# Patient Record
Sex: Female | Born: 2000 | Race: White | Hispanic: No | Marital: Single | Smoking: Never smoker
Health system: Southern US, Community
[De-identification: ages and names within clinical notes are randomized; demographics above are authoritative.]

---
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
## 2020-01-22 DIAGNOSIS — Z20822 Contact with and (suspected) exposure to covid-19: Secondary | ICD-10-CM | POA: Diagnosis not present

## 2020-01-22 DIAGNOSIS — R0981 Nasal congestion: Secondary | ICD-10-CM | POA: Diagnosis not present

## 2020-01-29 DIAGNOSIS — R1013 Epigastric pain: Secondary | ICD-10-CM | POA: Diagnosis not present

## 2020-02-04 DIAGNOSIS — K2970 Gastritis, unspecified, without bleeding: Secondary | ICD-10-CM | POA: Diagnosis not present

## 2020-02-09 DIAGNOSIS — Z3042 Encounter for surveillance of injectable contraceptive: Secondary | ICD-10-CM | POA: Diagnosis not present

## 2020-02-22 DIAGNOSIS — R0981 Nasal congestion: Secondary | ICD-10-CM | POA: Diagnosis not present

## 2020-02-22 DIAGNOSIS — R509 Fever, unspecified: Secondary | ICD-10-CM | POA: Diagnosis not present

## 2020-02-22 DIAGNOSIS — U071 COVID-19: Secondary | ICD-10-CM | POA: Diagnosis not present

## 2020-02-22 DIAGNOSIS — M791 Myalgia, unspecified site: Secondary | ICD-10-CM | POA: Diagnosis not present

## 2020-03-18 DIAGNOSIS — R110 Nausea: Secondary | ICD-10-CM | POA: Diagnosis not present

## 2020-03-18 DIAGNOSIS — R1013 Epigastric pain: Secondary | ICD-10-CM | POA: Diagnosis not present

## 2020-03-24 DIAGNOSIS — N898 Other specified noninflammatory disorders of vagina: Secondary | ICD-10-CM | POA: Diagnosis not present

## 2020-03-24 DIAGNOSIS — Z113 Encounter for screening for infections with a predominantly sexual mode of transmission: Secondary | ICD-10-CM | POA: Diagnosis not present

## 2020-03-26 DIAGNOSIS — F418 Other specified anxiety disorders: Secondary | ICD-10-CM | POA: Diagnosis not present

## 2020-03-26 DIAGNOSIS — Z0000 Encounter for general adult medical examination without abnormal findings: Secondary | ICD-10-CM | POA: Diagnosis not present

## 2020-03-26 DIAGNOSIS — Z1331 Encounter for screening for depression: Secondary | ICD-10-CM | POA: Diagnosis not present

## 2020-03-26 DIAGNOSIS — J45909 Unspecified asthma, uncomplicated: Secondary | ICD-10-CM | POA: Diagnosis not present

## 2020-03-26 DIAGNOSIS — R1013 Epigastric pain: Secondary | ICD-10-CM | POA: Diagnosis not present

## 2020-04-01 DIAGNOSIS — F609 Personality disorder, unspecified: Secondary | ICD-10-CM | POA: Diagnosis not present

## 2020-04-01 DIAGNOSIS — F438 Other reactions to severe stress: Secondary | ICD-10-CM | POA: Diagnosis not present

## 2020-04-26 DIAGNOSIS — Z3042 Encounter for surveillance of injectable contraceptive: Secondary | ICD-10-CM | POA: Diagnosis not present

## 2020-05-26 DIAGNOSIS — R1013 Epigastric pain: Secondary | ICD-10-CM | POA: Diagnosis not present

## 2020-05-26 DIAGNOSIS — J45909 Unspecified asthma, uncomplicated: Secondary | ICD-10-CM | POA: Diagnosis not present

## 2020-05-26 DIAGNOSIS — F418 Other specified anxiety disorders: Secondary | ICD-10-CM | POA: Diagnosis not present

## 2020-05-26 DIAGNOSIS — R110 Nausea: Secondary | ICD-10-CM | POA: Diagnosis not present

## 2020-07-26 DIAGNOSIS — Z3042 Encounter for surveillance of injectable contraceptive: Secondary | ICD-10-CM | POA: Diagnosis not present

## 2020-07-27 DIAGNOSIS — Z681 Body mass index (BMI) 19 or less, adult: Secondary | ICD-10-CM | POA: Diagnosis not present

## 2020-07-27 DIAGNOSIS — F418 Other specified anxiety disorders: Secondary | ICD-10-CM | POA: Diagnosis not present

## 2020-07-27 DIAGNOSIS — R1013 Epigastric pain: Secondary | ICD-10-CM | POA: Diagnosis not present

## 2020-07-27 DIAGNOSIS — R253 Fasciculation: Secondary | ICD-10-CM | POA: Diagnosis not present

## 2020-08-23 DIAGNOSIS — M25551 Pain in right hip: Secondary | ICD-10-CM | POA: Diagnosis not present

## 2020-09-14 DIAGNOSIS — J4520 Mild intermittent asthma, uncomplicated: Secondary | ICD-10-CM | POA: Diagnosis not present

## 2020-09-14 DIAGNOSIS — G4733 Obstructive sleep apnea (adult) (pediatric): Secondary | ICD-10-CM | POA: Diagnosis not present

## 2020-09-14 DIAGNOSIS — R5383 Other fatigue: Secondary | ICD-10-CM | POA: Diagnosis not present

## 2021-07-25 DIAGNOSIS — Z30430 Encounter for insertion of intrauterine contraceptive device: Secondary | ICD-10-CM | POA: Diagnosis not present

## 2021-07-25 DIAGNOSIS — Z3202 Encounter for pregnancy test, result negative: Secondary | ICD-10-CM | POA: Diagnosis not present

## 2021-08-16 DIAGNOSIS — Z30431 Encounter for routine checking of intrauterine contraceptive device: Secondary | ICD-10-CM | POA: Diagnosis not present

## 2021-10-01 DIAGNOSIS — J45909 Unspecified asthma, uncomplicated: Secondary | ICD-10-CM | POA: Diagnosis not present

## 2021-10-01 DIAGNOSIS — R0602 Shortness of breath: Secondary | ICD-10-CM | POA: Diagnosis not present

## 2021-10-01 DIAGNOSIS — R059 Cough, unspecified: Secondary | ICD-10-CM | POA: Diagnosis not present

## 2021-10-01 DIAGNOSIS — J018 Other acute sinusitis: Secondary | ICD-10-CM | POA: Diagnosis not present

## 2021-10-01 DIAGNOSIS — Z6820 Body mass index (BMI) 20.0-20.9, adult: Secondary | ICD-10-CM | POA: Diagnosis not present

## 2022-01-17 DIAGNOSIS — U099 Post covid-19 condition, unspecified: Secondary | ICD-10-CM | POA: Diagnosis not present

## 2022-01-17 DIAGNOSIS — J069 Acute upper respiratory infection, unspecified: Secondary | ICD-10-CM | POA: Diagnosis not present

## 2022-03-23 DIAGNOSIS — R509 Fever, unspecified: Secondary | ICD-10-CM | POA: Diagnosis not present

## 2022-03-23 DIAGNOSIS — J039 Acute tonsillitis, unspecified: Secondary | ICD-10-CM | POA: Diagnosis not present

## 2022-05-16 DIAGNOSIS — J3489 Other specified disorders of nose and nasal sinuses: Secondary | ICD-10-CM | POA: Diagnosis not present

## 2022-05-16 DIAGNOSIS — R0981 Nasal congestion: Secondary | ICD-10-CM | POA: Diagnosis not present

## 2022-05-16 DIAGNOSIS — R519 Headache, unspecified: Secondary | ICD-10-CM | POA: Diagnosis not present

## 2022-05-16 DIAGNOSIS — Z9109 Other allergy status, other than to drugs and biological substances: Secondary | ICD-10-CM | POA: Diagnosis not present

## 2022-06-19 DIAGNOSIS — S8002XA Contusion of left knee, initial encounter: Secondary | ICD-10-CM | POA: Diagnosis not present

## 2022-06-21 DIAGNOSIS — Z1329 Encounter for screening for other suspected endocrine disorder: Secondary | ICD-10-CM | POA: Diagnosis not present

## 2022-06-21 DIAGNOSIS — Z13220 Encounter for screening for lipoid disorders: Secondary | ICD-10-CM | POA: Diagnosis not present

## 2022-06-21 DIAGNOSIS — Z130 Encounter for screening for diseases of the blood and blood-forming organs and certain disorders involving the immune mechanism: Secondary | ICD-10-CM | POA: Diagnosis not present

## 2022-06-21 DIAGNOSIS — Z124 Encounter for screening for malignant neoplasm of cervix: Secondary | ICD-10-CM | POA: Diagnosis not present

## 2022-06-21 DIAGNOSIS — Z0000 Encounter for general adult medical examination without abnormal findings: Secondary | ICD-10-CM | POA: Diagnosis not present

## 2022-06-21 DIAGNOSIS — Z131 Encounter for screening for diabetes mellitus: Secondary | ICD-10-CM | POA: Diagnosis not present

## 2022-06-27 DIAGNOSIS — R102 Pelvic and perineal pain: Secondary | ICD-10-CM | POA: Diagnosis not present

## 2022-06-27 DIAGNOSIS — T839XXA Unspecified complication of genitourinary prosthetic device, implant and graft, initial encounter: Secondary | ICD-10-CM | POA: Diagnosis not present

## 2022-06-27 DIAGNOSIS — N3001 Acute cystitis with hematuria: Secondary | ICD-10-CM | POA: Diagnosis not present

## 2022-06-27 DIAGNOSIS — B009 Herpesviral infection, unspecified: Secondary | ICD-10-CM | POA: Diagnosis not present

## 2022-07-04 DIAGNOSIS — T839XXA Unspecified complication of genitourinary prosthetic device, implant and graft, initial encounter: Secondary | ICD-10-CM | POA: Diagnosis not present

## 2022-10-03 DIAGNOSIS — R102 Pelvic and perineal pain: Secondary | ICD-10-CM | POA: Diagnosis not present

## 2022-10-03 DIAGNOSIS — N83201 Unspecified ovarian cyst, right side: Secondary | ICD-10-CM | POA: Diagnosis not present

## 2022-10-19 DIAGNOSIS — N83201 Unspecified ovarian cyst, right side: Secondary | ICD-10-CM | POA: Diagnosis not present

## 2022-10-19 DIAGNOSIS — N83202 Unspecified ovarian cyst, left side: Secondary | ICD-10-CM | POA: Diagnosis not present

## 2022-10-19 DIAGNOSIS — R102 Pelvic and perineal pain: Secondary | ICD-10-CM | POA: Diagnosis not present

## 2022-10-19 DIAGNOSIS — Z975 Presence of (intrauterine) contraceptive device: Secondary | ICD-10-CM | POA: Diagnosis not present

## 2022-10-23 DIAGNOSIS — J018 Other acute sinusitis: Secondary | ICD-10-CM | POA: Diagnosis not present

## 2022-10-23 DIAGNOSIS — R051 Acute cough: Secondary | ICD-10-CM | POA: Diagnosis not present

## 2022-10-23 DIAGNOSIS — J45901 Unspecified asthma with (acute) exacerbation: Secondary | ICD-10-CM | POA: Diagnosis not present

## 2022-10-23 DIAGNOSIS — J3489 Other specified disorders of nose and nasal sinuses: Secondary | ICD-10-CM | POA: Diagnosis not present

## 2022-12-27 DIAGNOSIS — Z3202 Encounter for pregnancy test, result negative: Secondary | ICD-10-CM | POA: Diagnosis not present

## 2022-12-27 DIAGNOSIS — R110 Nausea: Secondary | ICD-10-CM | POA: Diagnosis not present

## 2022-12-27 DIAGNOSIS — Z681 Body mass index (BMI) 19 or less, adult: Secondary | ICD-10-CM | POA: Diagnosis not present

## 2023-01-18 DIAGNOSIS — J209 Acute bronchitis, unspecified: Secondary | ICD-10-CM | POA: Diagnosis not present

## 2023-01-18 DIAGNOSIS — R051 Acute cough: Secondary | ICD-10-CM | POA: Diagnosis not present

## 2023-01-18 DIAGNOSIS — J019 Acute sinusitis, unspecified: Secondary | ICD-10-CM | POA: Diagnosis not present

## 2023-01-29 DIAGNOSIS — J45901 Unspecified asthma with (acute) exacerbation: Secondary | ICD-10-CM | POA: Diagnosis not present

## 2023-01-29 DIAGNOSIS — R051 Acute cough: Secondary | ICD-10-CM | POA: Diagnosis not present

## 2023-02-06 DIAGNOSIS — J208 Acute bronchitis due to other specified organisms: Secondary | ICD-10-CM | POA: Diagnosis not present

## 2023-02-06 DIAGNOSIS — Z681 Body mass index (BMI) 19 or less, adult: Secondary | ICD-10-CM | POA: Diagnosis not present

## 2023-02-08 ENCOUNTER — Ambulatory Visit: Payer: Medicaid Other | Admitting: Allergy

## 2023-02-08 NOTE — Progress Notes (Deleted)
New Patient Note  RE: Molly Mckenzie MRN: [id number] DOB: 2000-12-15 Date of Office Visit: 02/08/2023  Consult requested by: No ref. provider found Primary care provider: Patient, [name]  Chief Complaint: No chief complaint on file.  History of Present Illness: I had the pleasure of seeing Molly Mckenzie for initial evaluation at the [facility] on 02/08/2023. She is a 23 y.o. female, who is referred here by Patient, [name] for the evaluation of ***.  Discussed the use of AI scribe software for clinical note transcription with the patient, who gave verbal consent to proceed.  History of Present Illness             ***  Assessment and Plan: Molly Mckenzie is a 23 y.o. female with: ***  Assessment and Plan               No follow-ups on file.  No orders of the defined types were placed in this encounter.  Lab Orders  No laboratory test(s) ordered today    Other allergy screening: Asthma: {Blank single:19197::"yes","no"} Rhino conjunctivitis: {Blank single:19197::"yes","no"} Food allergy: {Blank single:19197::"yes","no"} Medication allergy: {Blank single:19197::"yes","no"} Hymenoptera allergy: {Blank single:19197::"yes","no"} Urticaria: {Blank single:19197::"yes","no"} Eczema:{Blank single:19197::"yes","no"} History of recurrent infections suggestive of immunodeficency: {Blank single:19197::"yes","no"}  Diagnostics: Spirometry:  Tracings reviewed. Her effort: {Blank single:19197::"Good reproducible efforts.","It was hard to get consistent efforts and there is a question as to whether this reflects a maximal maneuver.","Poor effort, data can not be interpreted."} FVC: ***L FEV1: ***L, ***% predicted FEV1/FVC ratio: ***% Interpretation: {Blank single:19197::"Spirometry consistent with mild obstructive disease","Spirometry consistent with moderate obstructive disease","Spirometry consistent with severe obstructive disease","Spirometry consistent  with possible restrictive disease","Spirometry consistent with mixed obstructive and restrictive disease","Spirometry uninterpretable due to technique","Spirometry consistent with normal pattern","No overt abnormalities noted given today's efforts"}.  Please see scanned spirometry results for details.  Skin Testing: {Blank single:19197::"Select foods","Environmental allergy panel","Environmental allergy panel and select foods","Food allergy panel","None","Deferred due to recent antihistamines use"}. *** Results discussed with patient/family.   Past Medical History: There are no active problems to display for this patient.  No past medical history on file. Past Surgical History: No past surgical history on file. Medication List:  Current Outpatient Medications  Medication Sig Dispense Refill  . albuterol (PROVENTIL HFA;VENTOLIN HFA) 108 (90 BASE) MCG/ACT inhaler Inhale 2 puffs into the lungs every 6 (six) hours as needed for wheezing or shortness of breath.     No current facility-administered medications for this visit.   Allergies: No Known Allergies Social History: Social History   Socioeconomic History  . Marital status: Single    Spouse name: Not on file  . Number of children: Not on file  . Years of education: Not on file  . Highest education level: Not on file  Occupational History  . Not on file  Tobacco Use  . Smoking status: Never  . Smokeless tobacco: Not on file  Substance and Sexual Activity  . Alcohol use: No  . Drug use: No  . Sexual activity: Not on file  Other Topics Concern  . Not on file  Social History Narrative  . Not on file   Social Drivers of Health   Financial Resource Strain: Not on file  Food Insecurity: Not on file  Transportation Needs: Not on file  Physical Activity: Not on file  Stress: Not on file  Social Connections: Not on file   Lives in a ***. Smoking: *** Occupation: ***  Environmental History: Water Damage/mildew in the  house: {Blank single:19197::"yes","no"} Carpet in the family room: {Blank single:19197::"yes","no"} Carpet in the bedroom: {Blank single:19197::"yes","no"} Heating: {Blank single:19197::"electric","gas","heat pump"} Cooling: {Blank single:19197::"central","window","heat pump"} Pet: {Blank single:19197::"yes ***","no"}  Family History: No family history on file. Problem                               Relation Asthma                                   *** Eczema                                *** Food allergy                          *** Allergic rhino conjunctivitis     ***  Review of Systems  Constitutional:  Negative for appetite change, chills, fever and unexpected weight change.  HENT:  Negative for congestion and rhinorrhea.   Eyes:  Negative for itching.  Respiratory:  Negative for cough, chest tightness, shortness of breath and wheezing.   Cardiovascular:  Negative for chest pain.  Gastrointestinal:  Negative for abdominal pain.  Genitourinary:  Negative for difficulty urinating.  Skin:  Negative for rash.  Neurological:  Negative for headaches.   Objective: There were no vitals taken for this visit. There is no height or weight on file to calculate BMI. Physical Exam Vitals and nursing note reviewed.  Constitutional:      Appearance: Normal appearance. She is well-developed.  HENT:     Head: Normocephalic and atraumatic.     Right Ear: Tympanic membrane and external ear normal.     Left Ear: Tympanic membrane and external ear normal.     Nose: Nose normal.     Mouth/Throat:     Mouth: Mucous membranes are moist.     Pharynx: Oropharynx is clear.  Eyes:     Conjunctiva/sclera: Conjunctivae normal.  Cardiovascular:     Rate and Rhythm: Normal rate and regular rhythm.     Heart sounds: Normal heart sounds. No murmur heard.    No friction rub. No gallop.  Pulmonary:     Effort: Pulmonary effort is normal.     Breath sounds: Normal breath sounds. No wheezing, rhonchi  or rales.  Musculoskeletal:     Cervical back: Neck supple.  Skin:    General: Skin is warm.     Findings: No rash.  Neurological:     Mental Status: She is alert and oriented to person, place, and time.  Psychiatric:        Behavior: Behavior normal.  The plan was reviewed with the patient/family, and all questions/concerned were addressed.  It was my pleasure to see Molly Mckenzie today and participate in her care. Please feel free to contact me with any questions or concerns.  Sincerely,  Wyline Mood, DO Allergy & Immunology  [facility] office: [phone] [facility] office: [phone]

## 2023-02-19 NOTE — Progress Notes (Deleted)
 New Patient Note  RE: Molly Mckenzie MRN: [id number] DOB: [birth year] Date of Office Visit: 02/20/2023  Consult requested by: No ref. provider found Primary care provider: Patient, [name]  Chief Complaint: No chief complaint on file.  History of Present Illness: I had the pleasure of seeing Molly Mckenzie for initial evaluation at the [facility] on 02/19/2023. She is a 23 y.o. female, who is referred here by Patient, [name] for the evaluation of ***.  Discussed the use of AI scribe software for clinical note transcription with the patient, who gave verbal consent to proceed.  History of Present Illness             ***  Assessment and Plan: [name] is a 23 y.o. female with: ***  Assessment and Plan               No follow-ups on file.  No orders of the defined types were placed in this encounter.  Lab Orders  No laboratory test(s) ordered today    Other allergy screening: Asthma: {Blank single:19197::yes,no} Rhino conjunctivitis: {Blank single:19197::yes,no} Food allergy: {Blank single:19197::yes,no} Medication allergy: {Blank single:19197::yes,no} Hymenoptera allergy: {Blank single:19197::yes,no} Urticaria: {Blank single:19197::yes,no} Eczema:{Blank single:19197::yes,no} History of recurrent infections suggestive of immunodeficency: {Blank single:19197::yes,no}  Diagnostics: Spirometry:  Tracings reviewed. Her effort: {Blank single:19197::Good reproducible efforts.,It was hard to get consistent efforts and there is a question as to whether this reflects a maximal maneuver.,Poor effort, data can not be interpreted.} FVC: ***L FEV1: ***L, ***% predicted FEV1/FVC ratio: ***% Interpretation: {Blank single:19197::Spirometry consistent with mild obstructive disease,Spirometry consistent with moderate obstructive disease,Spirometry consistent with severe obstructive disease,Spirometry consistent  with possible restrictive disease,Spirometry consistent with mixed obstructive and restrictive disease,Spirometry uninterpretable due to technique,Spirometry consistent with normal pattern,No overt abnormalities noted given today's efforts}.  Please see scanned spirometry results for details.  Skin Testing: {Blank single:19197::Select foods,Environmental allergy panel,Environmental allergy panel and select foods,Food allergy panel,None,Deferred due to recent antihistamines use}. *** Results discussed with patient/family.   Past Medical History: There are no active problems to display for this patient.  No past medical history on file. Past Surgical History: No past surgical history on file. Medication List:  Current Outpatient Medications  Medication Sig Dispense Refill  . albuterol  (PROVENTIL  HFA;VENTOLIN  HFA) 108 (90 BASE) MCG/ACT inhaler Inhale 2 puffs into the lungs every 6 (six) hours as needed for wheezing or shortness of breath.     No current facility-administered medications for this visit.   Allergies: No Known Allergies Social History: Social History   Socioeconomic History  . Marital status: Single    Spouse name: Not on file  . Number of children: Not on file  . Years of education: Not on file  . Highest education level: Not on file  Occupational History  . Not on file  Tobacco Use  . Smoking status: Never  . Smokeless tobacco: Not on file  Substance and Sexual Activity  . Alcohol use: No  . Drug use: No  . Sexual activity: Not on file  Other Topics Concern  . Not on file  Social History Narrative  . Not on file   Social Drivers of Health   Financial Resource Strain: Not on file  Food Insecurity: Not on file  Transportation Needs: Not on file  Physical Activity: Not on file  Stress: Not on file  Social Connections: Not on file   Lives in a ***. Smoking: *** Occupation: ***  Environmental History: Water Damage/mildew in the  house: {Blank single:19197::yes,no} Carpet in the family room: {Blank single:19197::yes,no} Carpet in the bedroom: {Blank single:19197::yes,no} Heating: {Blank single:19197::electric,gas,heat pump} Cooling: {Blank single:19197::central,window,heat pump} Pet: {Blank single:19197::yes ***,no}  Family History: No family history on file. Problem                               Relation Asthma                                   *** Eczema                                *** Food allergy                          *** Allergic rhino conjunctivitis     ***  Review of Systems  Constitutional:  Negative for appetite change, chills, fever and unexpected weight change.  HENT:  Negative for congestion and rhinorrhea.   Eyes:  Negative for itching.  Respiratory:  Negative for cough, chest tightness, shortness of breath and wheezing.   Cardiovascular:  Negative for chest pain.  Gastrointestinal:  Negative for abdominal pain.  Genitourinary:  Negative for difficulty urinating.  Skin:  Negative for rash.  Neurological:  Negative for headaches.   Objective: There were no vitals taken for this visit. There is no height or weight on file to calculate BMI. Physical Exam Vitals and nursing note reviewed.  Constitutional:      Appearance: Normal appearance. She is well-developed.  HENT:     Head: Normocephalic and atraumatic.     Right Ear: Tympanic membrane and external ear normal.     Left Ear: Tympanic membrane and external ear normal.     Nose: Nose normal.     Mouth/Throat:     Mouth: Mucous membranes are moist.     Pharynx: Oropharynx is clear.  Eyes:     Conjunctiva/sclera: Conjunctivae normal.  Cardiovascular:     Rate and Rhythm: Normal rate and regular rhythm.     Heart sounds: Normal heart sounds. No murmur heard.    No friction rub. No gallop.  Pulmonary:     Effort: Pulmonary effort is normal.     Breath sounds: Normal breath sounds. No wheezing, rhonchi  or rales.  Musculoskeletal:     Cervical back: Neck supple.  Skin:    General: Skin is warm.     Findings: No rash.  Neurological:     Mental Status: She is alert and oriented to person, place, and time.  Psychiatric:        Behavior: Behavior normal.  The plan was reviewed with the patient/family, and all questions/concerned were addressed.  It was my pleasure to see Yachet today and participate in her care. Please feel free to contact me with any questions or concerns.  Sincerely,  Orlan Cramp, DO Allergy & Immunology  [facility]  [facility] office: [phone] [facility] office: [phone]

## 2023-02-20 ENCOUNTER — Ambulatory Visit: Payer: Medicaid Other | Admitting: Allergy

## 2023-02-21 ENCOUNTER — Encounter: Payer: Self-pay | Admitting: Allergy

## 2023-02-21 ENCOUNTER — Ambulatory Visit (INDEPENDENT_AMBULATORY_CARE_PROVIDER_SITE_OTHER): Payer: Medicaid Other | Admitting: Allergy

## 2023-02-21 VITALS — BP 110/60 | HR 72 | Temp 98.7°F | Resp 16 | Ht 66.0 in | Wt 113.0 lb

## 2023-02-21 DIAGNOSIS — J3089 Other allergic rhinitis: Secondary | ICD-10-CM

## 2023-02-21 DIAGNOSIS — J4520 Mild intermittent asthma, uncomplicated: Secondary | ICD-10-CM | POA: Diagnosis not present

## 2023-02-21 DIAGNOSIS — R12 Heartburn: Secondary | ICD-10-CM | POA: Diagnosis not present

## 2023-02-21 DIAGNOSIS — R058 Other specified cough: Secondary | ICD-10-CM | POA: Diagnosis not present

## 2023-02-21 MED ORDER — BUDESONIDE-FORMOTEROL FUMARATE 80-4.5 MCG/ACT IN AERO: 2.0000 | INHALATION_SPRAY | Freq: Two times a day (BID) | RESPIRATORY_TRACT | 1 refills | Status: AC

## 2023-02-21 MED ORDER — ALBUTEROL SULFATE HFA 108 (90 BASE) MCG/ACT IN AERS: 2.0000 | INHALATION_SPRAY | RESPIRATORY_TRACT | 1 refills | Status: AC | PRN

## 2023-02-21 MED ORDER — OMEPRAZOLE MAGNESIUM 20 MG PO TBEC: 20.0000 mg | DELAYED_RELEASE_TABLET | Freq: Every day | ORAL | 0 refills | Status: AC

## 2023-02-21 NOTE — Progress Notes (Signed)
 New Patient Note  RE: Molly Mckenzie MRN: [id number] DOB: Jun 12, 2000 Date of Office Visit: 02/21/2023  Consult requested by: No ref. provider found Primary care provider: Patient, [name]  Chief Complaint: Cough (Since before christmas )  History of Present Illness: I had the pleasure of seeing Molly Mckenzie for initial evaluation at the [facility] on 02/21/2023. She is a 23 y.o. female, who is self-referred here for the evaluation of cough.  Discussed the use of AI scribe software for clinical note transcription with the patient, who gave verbal consent to proceed.  For approximately six weeks, she has experienced a persistent cough that began just before Christmas. The cough alternates between dry and productive with mucus and has been severe enough to induce vomiting on two occasions. She experiences chest tightness and shortness of breath, with wheezing occurring on most days. Her symptoms worsen with physical activity and when lying down, particularly at bedtime and upon waking.  She has attempted to manage her symptoms with an albuterol  inhaler, but finds it triggers her cough and is ineffective. A humidifier worsened her symptoms. She completed a ten-day course of antibiotics prescribed by urgent care for suspected pneumonia, but it did not resolve her symptoms. A subsequent chest x-ray showed no infection, and she was prescribed prednisone, which provided temporary relief. She has been advised to use a nebulizer but has not yet acquired one due to financial constraints. She has visited urgent care twice, where she was initially diagnosed with pneumonia without imaging. After the x-ray, she was told it might be asthma-related. Her primary care provider has also suggested asthma as a possible cause.     In the last month, frequency of symptoms: daily. Frequency of nocturnal symptoms: depends. Frequency of SABA use: daily. Interference with physical activity: yes.   the  last 12 months, emergency room visits/urgent care visits/doctor office visits or hospitalizations due to respiratory issues: twice to UC. In the last 12 months, oral steroids courses: 1. Lifetime history of hospitalization for respiratory issues: no. Prior intubations: no.  History of pneumonia: maybe. She was not evaluated by allergist/pulmonologist in the past. Smoking exposure: denies. Up to date with flu vaccine: no. Up to date with COVID-19 vaccine: no. Prior Covid-19 infection: yes. History of reflux: sometimes.  Assessment and Plan: Molly Mckenzie is a 23 y.o. female with: Cough  Reactive airway disease, mild intermittent, uncomplicated Heartburn Persistent cough for 1.5 months, alternating between dry and productive, exacerbated by exertion and lying down. Previous treatment with albuterol  inhaler, antibiotics, and prednisone with partial response. Singulair caused hallucinations. Today's spirometry was normal pattern with 3% and 90cc improvement in FEV1 post bronchodilator treatment. Clinically feeling unchanged.  Discussed that cough can have multiple etiologies. Given her symptoms and timeline will do 1 month trial of Symbicort  with omeprazole . Daily controller medication(s): start Symbicort  80mcg 2 puffs twice a day with spacer and rinse mouth afterwards. Spacer given and demonstrated proper use with inhaler. Patient understood technique and all questions/concerned were addressed.  If the medication is too expensive let me know.  May use albuterol  rescue inhaler 2 puffs or nebulizer every 4 to 6 hours as needed for shortness of breath, chest tightness, coughing, and wheezing. May use albuterol  rescue inhaler 2 puffs 5 to 15 minutes prior to strenuous physical activities. Monitor frequency of use - if you need to use it more than twice per week on a consistent basis let us  know.  See handout for lifestyle and dietary  modifications. Start omeprazole  20mg  once day  x 1 month - nothing to eat or  drink for 20-30 minutes afterwards.   Other allergic rhinitis Reports symptoms in the fall, currently managed with Benadryl as needed. Consider skin testing in future.   Return in about 4 weeks (around 03/21/2023).  Meds ordered this encounter  Medications   omeprazole  (PRILOSEC  OTC) 20 MG tablet    Sig: Take 1 tablet (20 mg total) by mouth daily.    Dispense:  30 tablet    Refill:  0   budesonide -formoterol  (SYMBICORT ) 80-4.5 MCG/ACT inhaler    Sig: Inhale 2 puffs into the lungs in the morning and at bedtime. with spacer and rinse mouth afterwards.    Dispense:  1 each    Refill:  1   albuterol  (VENTOLIN  HFA) 108 (90 Base) MCG/ACT inhaler    Sig: Inhale 2 puffs into the lungs every 4 (four) hours as needed for wheezing or shortness of breath.    Dispense:  18 g    Refill:  1   Lab Orders  No laboratory test(s) ordered today    Other allergy screening: Rhino conjunctivitis: yes Mainly in the fall. And takes benadryl prn with good benefit.  Food allergy: no Medication allergy: yes Hymenoptera allergy: no Localized swelling.  Urticaria: no Eczema:no History of recurrent infections suggestive of immunodeficency: no  Diagnostics: Spirometry:  Tracings reviewed. Her effort: Good reproducible efforts. FVC: 3.58L FEV1: 3.00L, 84% predicted FEV1/FVC ratio: 84% Interpretation: Spirometry consistent with normal pattern with 3% and 90cc improvement in FEV1 post bronchodilator treatment. Clinically feeling unchanged.   Please see scanned spirometry results for details.  Results discussed with patient/family.   Past Medical History: There are no active problems to display for this patient.  History reviewed. No pertinent past medical history. Past Surgical History: History reviewed. No pertinent surgical history. Medication List:  Current Outpatient Medications  Medication Sig Dispense Refill   acyclovir (ZOVIRAX) 400 MG tablet Take 400 mg by mouth daily.      budesonide -formoterol  (SYMBICORT ) 80-4.5 MCG/ACT inhaler Inhale 2 puffs into the lungs in the morning and at bedtime. with spacer and rinse mouth afterwards. 1 each 1   omeprazole  (PRILOSEC  OTC) 20 MG tablet Take 1 tablet (20 mg total) by mouth daily. 30 tablet 0   albuterol  (VENTOLIN  HFA) 108 (90 Base) MCG/ACT inhaler Inhale 2 puffs into the lungs every 4 (four) hours as needed for wheezing or shortness of breath. 18 g 1   No current facility-administered medications for this visit.   Allergies: Allergies  Allergen Reactions   Montelukast Other (See Comments)    Hallucinations    Social History: Social History   Socioeconomic History   Marital status: Single    Spouse name: Not on file   Number of children: Not on file   Years of education: Not on file   Highest education level: Not on file  Occupational History   Not on file  Tobacco Use   Smoking status: Never    Passive exposure: Past (grandparents and mother)   Smokeless tobacco: Never  Vaping Use   Vaping status: Never Used  Substance and Sexual Activity   Alcohol use: No   Drug use: No   Sexual activity: Not on file  Other Topics Concern   Not on file  Social History Narrative   Not on file   Social Drivers of Health   Financial Resource Strain: Not on file  Food Insecurity: Not on file  Transportation Needs:  Not on file  Physical Activity: Not on file  Stress: Not on file  Social Connections: Not on file   Lives in a trailer. 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  HENT:  Negative for congestion and rhinorrhea.   Eyes:  Negative for itching.  Respiratory:  Positive for cough, chest tightness, shortness of breath and wheezing.   Cardiovascular:  Negative for chest pain.  Gastrointestinal:  Negative for abdominal pain.  Genitourinary:  Negative for difficulty urinating.  Skin:  Negative for rash.  Neurological:  Negative for headaches.    Objective: BP 110/60   Pulse 72   Temp 98.7 F (37.1 C) (Temporal)   Resp 16   Ht 5' 6 (1.676 m)   Wt 113 lb (51.3 kg)   SpO2 98%   BMI 18.24 kg/m  Body mass index is 18.24 kg/m. Physical Exam Vitals and nursing note reviewed.  Constitutional:      Appearance: Normal appearance. She is well-developed.  HENT:     Head: Normocephalic and atraumatic.     Right Ear: Tympanic membrane and external ear normal.     Left Ear: Tympanic membrane and external ear normal.     Nose: Nose normal.     Mouth/Throat:     Mouth: Mucous membranes are moist.     Pharynx: Oropharynx is clear.  Eyes:     Conjunctiva/sclera: Conjunctivae normal.  Cardiovascular:     Rate and Rhythm: Normal rate and regular rhythm.     Heart sounds: Normal heart sounds. No murmur heard.    No friction rub. No gallop.  Pulmonary:     Effort: Pulmonary effort is normal.     Breath sounds: Normal breath sounds. No wheezing, rhonchi or rales.  Musculoskeletal:     Cervical back: Neck supple.  Skin:    General: Skin is warm.     Findings: No rash.  Neurological:     Mental Status: She is alert and oriented to person, place, and time.  Psychiatric:        Behavior: Behavior normal.   The plan was reviewed with the patient/family, and all questions/concerned were addressed.  It was my pleasure to see Molly Mckenzie today and participate in her care. Please feel free to contact me with any questions or concerns.  Sincerely,  Orlan Cramp,  DO Allergy & Immunology  [facility]  [place] office: [phone] [facility] office: [phone]

## 2023-02-21 NOTE — Patient Instructions (Addendum)
 Coughing:  Daily controller medication(s): start Symbicort  80mcg 2 puffs twice a day with spacer and rinse mouth afterwards. Spacer given and demonstrated proper use with inhaler. Patient understood technique and all questions/concerned were addressed.  If the medication is too expensive let me know.  May use albuterol  rescue inhaler 2 puffs or nebulizer every 4 to 6 hours as needed for shortness of breath, chest tightness, coughing, and wheezing. May use albuterol  rescue inhaler 2 puffs 5 to 15 minutes prior to strenuous physical activities. Monitor frequency of use - if you need to use it more than twice per week on a consistent basis let us  know.  Breathing control goals:  Full participation in all desired activities (may need albuterol  before activity) Albuterol  use two times or less a week on average (not counting use with activity) Cough interfering with sleep two times or less a month Oral steroids no more than once a year No hospitalizations   Heartburn  See handout for lifestyle and dietary modifications. Start omeprazole  20mg  once day  x 1 month - nothing to eat or drink for 20-30 minutes afterwards.   Follow up in 4 weeks or sooner if needed.

## 2023-03-20 NOTE — Progress Notes (Deleted)
 Follow Up Note  RE: Molly Mckenzie MRN: [id number] DOB: 01/06/2001 Date of Office Visit: 03/21/2023  Referring provider: No ref. provider found Primary care provider: Patient, [name]  Chief Complaint: No chief complaint on file.  History of Present Illness: I had the pleasure of seeing Molly Mckenzie for a follow up visit at the [facility] on 03/20/2023. She is a 23 y.o. female, who is being followed for cough, RAD, heartburn, allergic rhinitis. Her previous allergy office visit was on 02/21/2023 with Dr. Selena Batten. Today is a regular follow up visit.  Discussed the use of AI scribe software for clinical note transcription with the patient, who gave verbal consent to proceed.  History of Present Illness            ***  Assessment and Plan: [name] is a 23 y.o. female with: Cough  Reactive airway disease, mild intermittent, uncomplicated Heartburn Persistent cough for 1.5 months, alternating between dry and productive, exacerbated by exertion and lying down. Previous treatment with albuterol inhaler, antibiotics, and prednisone with partial response. Singulair caused hallucinations. Today's spirometry was normal pattern with 3% and 90cc improvement in FEV1 post bronchodilator treatment. Clinically feeling unchanged.  Discussed that cough can have multiple etiologies. Given her symptoms and timeline will do 1 month trial of Symbicort with omeprazole. Daily controller medication(s): start Symbicort 2 puffs twice a day with spacer and rinse mouth afterwards. Spacer given and demonstrated proper use with inhaler. Patient understood technique and all questions/concerned were addressed.  If the medication is too expensive let me know.  May use albuterol rescue inhaler 2 puffs or nebulizer every 4 to 6 hours as needed for shortness of breath, chest tightness, coughing, and wheezing. May use albuterol rescue inhaler 2 puffs 5 to 15 minutes prior to strenuous physical  activities. Monitor frequency of use - if you need to use it more than twice per week on a consistent basis let us know.  See handout for lifestyle and dietary modifications. Start omeprazole 20mg  once day  x 1 month - nothing to eat or drink for 20-30 minutes afterwards.    Other allergic rhinitis Reports symptoms in the fall, currently managed with Benadryl as needed. Consider skin testing in future.  Assessment and Plan              No follow-ups on file.  No orders of the defined types were placed in this encounter.  Lab Orders  No laboratory test(s) ordered today    Diagnostics: Spirometry:  Tracings reviewed. Her effort: {Blank single:19197::"Good reproducible efforts.","It was hard to get consistent efforts and there is a question as to whether this reflects a maximal maneuver.","Poor effort, data can not be interpreted."} FVC: ***L FEV1: ***L, ***% predicted FEV1/FVC ratio: ***% Interpretation: {Blank single:19197::"Spirometry consistent with mild obstructive disease","Spirometry consistent with moderate obstructive disease","Spirometry consistent with severe obstructive disease","Spirometry consistent with possible restrictive disease","Spirometry consistent with mixed obstructive and restrictive disease","Spirometry uninterpretable due to technique","Spirometry consistent with normal pattern","No overt abnormalities noted given today's efforts"}.  Please see scanned spirometry results for details.  Skin Testing: {Blank single:19197::"Select foods","Environmental allergy panel","Environmental allergy panel and select foods","Food allergy panel","None","Deferred due to recent antihistamines use"}. *** Results discussed with patient/family.   Medication List:  Current Outpatient Medications  Medication Sig Dispense Refill  . acyclovir (ZOVIRAX) 400 MG tablet Take 400 mg by mouth daily.    Marland Kitchen albuterol (VENTOLIN HFA) 108 (90 Base) MCG/ACT inhaler Inhale 2 puffs into the  lungs every  4 (four) hours as needed for wheezing or shortness of breath. 18 g 1  . budesonide-formoterol (SYMBICORT) 80-4.5 MCG/ACT inhaler Inhale 2 puffs into the lungs in the morning and at bedtime. with spacer and rinse mouth afterwards. 1 each 1  . omeprazole (PRILOSEC OTC) 20 MG tablet Take 1 tablet (20 mg total) by mouth daily. 30 tablet 0   No current facility-administered medications for this visit.   Allergies: Allergies  Allergen Reactions  . Montelukast Other (See Comments)    Hallucinations    I reviewed her past medical history, social history, family history, and environmental history and no significant changes have been reported from her previous visit.  Review of Systems  Constitutional:  Negative for appetite change, chills, fever and unexpected weight change.  HENT:  Negative for congestion and rhinorrhea.   Eyes:  Negative for itching.  Respiratory:  Positive for cough, chest tightness, shortness of breath and wheezing.   Cardiovascular:  Negative for chest pain.  Gastrointestinal:  Negative for abdominal pain.  Genitourinary:  Negative for difficulty urinating.  Skin:  Negative for rash.  Neurological:  Negative for headaches.   Objective: There were no vitals taken for this visit. There is no height or weight on file to calculate BMI. Physical Exam Vitals and nursing note reviewed.  Constitutional:      Appearance: Normal appearance. She is well-developed.  HENT:     Head: Normocephalic and atraumatic.     Right Ear: Tympanic membrane and external ear normal.     Left Ear: Tympanic membrane and external ear normal.     Nose: Nose normal.     Mouth/Throat:     Mouth: Mucous membranes are moist.     Pharynx: Oropharynx is clear.  Eyes:     Conjunctiva/sclera: Conjunctivae normal.  Cardiovascular:     Rate and Rhythm: Normal rate and regular rhythm.     Heart sounds: Normal heart sounds. No murmur heard.    No friction rub. No gallop.  Pulmonary:      Effort: Pulmonary effort is normal.     Breath sounds: Normal breath sounds. No wheezing, rhonchi or rales.  Musculoskeletal:     Cervical back: Neck supple.  Skin:    General: Skin is warm.     Findings: No rash.  Neurological:     Mental Status: She is alert and oriented to person, place, and time.  Psychiatric:        Behavior: Behavior normal.  Previous notes and tests were reviewed. The plan was reviewed with the patient/family, and all questions/concerned were addressed.  It was my pleasure to see Beuna today and participate in her care. Please feel free to contact me with any questions or concerns.  Sincerely,  Wyline Mood, DO Allergy & Immunology  [facility] office: [phone] [facility] office: [phone]

## 2023-03-21 ENCOUNTER — Ambulatory Visit: Payer: BC Managed Care – PPO | Admitting: Allergy

## 2023-06-15 DIAGNOSIS — R0981 Nasal congestion: Secondary | ICD-10-CM | POA: Diagnosis not present

## 2023-06-15 DIAGNOSIS — R051 Acute cough: Secondary | ICD-10-CM | POA: Diagnosis not present

## 2023-06-15 DIAGNOSIS — J029 Acute pharyngitis, unspecified: Secondary | ICD-10-CM | POA: Diagnosis not present

## 2023-07-31 DIAGNOSIS — Z30431 Encounter for routine checking of intrauterine contraceptive device: Secondary | ICD-10-CM | POA: Diagnosis not present

## 2023-09-13 DIAGNOSIS — M79632 Pain in left forearm: Secondary | ICD-10-CM | POA: Diagnosis not present

## 2023-09-13 DIAGNOSIS — S5012XA Contusion of left forearm, initial encounter: Secondary | ICD-10-CM | POA: Diagnosis not present

## 2023-11-15 DIAGNOSIS — Z3201 Encounter for pregnancy test, result positive: Secondary | ICD-10-CM | POA: Diagnosis not present
